# Patient Record
Sex: Female | Born: 1975 | Race: White | Hispanic: No | Marital: Single | Smoking: Current every day smoker
Health system: Southern US, Community
[De-identification: ages and names within clinical notes are randomized; demographics above are authoritative.]

## PROBLEM LIST (undated history)

## (undated) DIAGNOSIS — M5412 Radiculopathy, cervical region: Secondary | ICD-10-CM

## (undated) DIAGNOSIS — J45909 Unspecified asthma, uncomplicated: Secondary | ICD-10-CM

## (undated) DIAGNOSIS — G894 Chronic pain syndrome: Secondary | ICD-10-CM

## (undated) DIAGNOSIS — E7800 Pure hypercholesterolemia, unspecified: Secondary | ICD-10-CM

## (undated) HISTORY — PX: OTHER SURGICAL HISTORY: SHX169

## (undated) HISTORY — PX: TONSILLECTOMY AND ADENOIDECTOMY: SUR1326

## (undated) HISTORY — DX: Pure hypercholesterolemia, unspecified: E78.00

---

## 2014-05-20 ENCOUNTER — Encounter (HOSPITAL_COMMUNITY): Payer: Self-pay | Admitting: Emergency Medicine

## 2014-05-20 ENCOUNTER — Emergency Department (HOSPITAL_COMMUNITY)
Admission: EM | Admit: 2014-05-20 | Discharge: 2014-05-20 | Disposition: A | Discharge status: Home / Self Care | Payer: Medicaid Other | Attending: Emergency Medicine | Admitting: Emergency Medicine

## 2014-05-20 DIAGNOSIS — Z720 Tobacco use: Secondary | ICD-10-CM | POA: Insufficient documentation

## 2014-05-20 DIAGNOSIS — S5011XA Contusion of right forearm, initial encounter: Secondary | ICD-10-CM | POA: Insufficient documentation

## 2014-05-20 DIAGNOSIS — S5012XA Contusion of left forearm, initial encounter: Secondary | ICD-10-CM | POA: Insufficient documentation

## 2014-05-20 DIAGNOSIS — R4182 Altered mental status, unspecified: Secondary | ICD-10-CM

## 2014-05-20 DIAGNOSIS — Y9389 Activity, other specified: Secondary | ICD-10-CM | POA: Diagnosis not present

## 2014-05-20 DIAGNOSIS — S60222A Contusion of left hand, initial encounter: Secondary | ICD-10-CM | POA: Diagnosis not present

## 2014-05-20 DIAGNOSIS — Y92009 Unspecified place in unspecified non-institutional (private) residence as the place of occurrence of the external cause: Secondary | ICD-10-CM | POA: Insufficient documentation

## 2014-05-20 DIAGNOSIS — Y998 Other external cause status: Secondary | ICD-10-CM | POA: Diagnosis not present

## 2014-05-20 DIAGNOSIS — F10129 Alcohol abuse with intoxication, unspecified: Secondary | ICD-10-CM | POA: Diagnosis not present

## 2014-05-20 DIAGNOSIS — W2209XA Striking against other stationary object, initial encounter: Secondary | ICD-10-CM | POA: Insufficient documentation

## 2014-05-20 DIAGNOSIS — Z3202 Encounter for pregnancy test, result negative: Secondary | ICD-10-CM | POA: Insufficient documentation

## 2014-05-20 DIAGNOSIS — F1010 Alcohol abuse, uncomplicated: Secondary | ICD-10-CM | POA: Diagnosis present

## 2014-05-20 DIAGNOSIS — F10929 Alcohol use, unspecified with intoxication, unspecified: Secondary | ICD-10-CM

## 2014-05-20 LAB — URINE MICROSCOPIC-ADD ON

## 2014-05-20 LAB — COMPREHENSIVE METABOLIC PANEL
ALBUMIN: 3.6 g/dL (ref 3.5–5.2)
ALT: 10 U/L (ref 0–35)
ANION GAP: 9 (ref 5–15)
AST: 20 U/L (ref 0–37)
Alkaline Phosphatase: 39 U/L (ref 39–117)
BILIRUBIN TOTAL: 0.6 mg/dL (ref 0.3–1.2)
BUN: 12 mg/dL (ref 6–23)
CO2: 19 mmol/L (ref 19–32)
Calcium: 8.5 mg/dL (ref 8.4–10.5)
Chloride: 109 mmol/L (ref 96–112)
Creatinine, Ser: 0.72 mg/dL (ref 0.50–1.10)
GFR calc Af Amer: >90 mL/min (ref >90)
GFR calc non Af Amer: >90 mL/min (ref >90)
Glucose, Bld: 89 mg/dL (ref 70–99)
Potassium: 3.8 mmol/L (ref 3.5–5.1)
Sodium: 137 mmol/L (ref 135–145)
Total Protein: 5.8 g/dL — ABNORMAL LOW (ref 6.0–8.3)

## 2014-05-20 LAB — CBC WITH DIFFERENTIAL/PLATELET
BASOS ABS: 0 10*3/uL (ref 0.0–0.1)
Basophils Relative: 0 % (ref 0–1)
EOS ABS: 0 10*3/uL (ref 0.0–0.7)
Eosinophils Relative: 0 % (ref 0–5)
HEMATOCRIT: 34.1 % — AB (ref 36.0–46.0)
Hemoglobin: 11.5 g/dL — ABNORMAL LOW (ref 12.0–15.0)
LYMPHS ABS: 1.1 10*3/uL (ref 0.7–4.0)
Lymphocytes Relative: 7 % — ABNORMAL LOW (ref 12–46)
MCH: 30.2 pg (ref 26.0–34.0)
MCHC: 33.7 g/dL (ref 30.0–36.0)
MCV: 89.5 fL (ref 78.0–100.0)
MONO ABS: 1 10*3/uL (ref 0.1–1.0)
Monocytes Relative: 6 % (ref 3–12)
NEUTROS PCT: 87 % — AB (ref 43–77)
Neutro Abs: 13.5 10*3/uL — ABNORMAL HIGH (ref 1.7–7.7)
PLATELETS: 245 10*3/uL (ref 150–400)
RBC: 3.81 MIL/uL — ABNORMAL LOW (ref 3.87–5.11)
RDW: 13 % (ref 11.5–15.5)
WBC: 15.5 10*3/uL — AB (ref 4.0–10.5)

## 2014-05-20 LAB — URINALYSIS, ROUTINE W REFLEX MICROSCOPIC
BILIRUBIN URINE: NEGATIVE
GLUCOSE, UA: NEGATIVE mg/dL
HGB URINE DIPSTICK: NEGATIVE
KETONES UR: NEGATIVE mg/dL
Leukocytes, UA: NEGATIVE
Nitrite: POSITIVE — AB
PROTEIN: NEGATIVE mg/dL
Specific Gravity, Urine: 1.008 (ref 1.005–1.030)
Urobilinogen, UA: 0.2 mg/dL (ref 0.0–1.0)
pH: 5.5 (ref 5.0–8.0)

## 2014-05-20 LAB — RAPID URINE DRUG SCREEN, HOSP PERFORMED
Amphetamines: NOT DETECTED
BARBITURATES: NOT DETECTED
Benzodiazepines: NOT DETECTED
COCAINE: NOT DETECTED
Opiates: NOT DETECTED
TETRAHYDROCANNABINOL: NOT DETECTED

## 2014-05-20 LAB — PREGNANCY, URINE: PREG TEST UR: NEGATIVE

## 2014-05-20 LAB — ACETAMINOPHEN LEVEL

## 2014-05-20 LAB — ETHANOL: ALCOHOL ETHYL (B): 140 mg/dL — AB (ref 0–9)

## 2014-05-20 MED ORDER — SODIUM CHLORIDE 0.9 % IV SOLN: 1000.0000 mL | INTRAVENOUS | Status: DC

## 2014-05-20 MED ORDER — SODIUM CHLORIDE 0.9 % IV SOLN
1000.0000 mL | Freq: Once | INTRAVENOUS | Status: AC
  Administered 2014-05-20: 1000 mL via INTRAVENOUS

## 2014-05-20 MED ORDER — ONDANSETRON 8 MG PO TBDP: 8.0000 mg | ORAL_TABLET | Freq: Three times a day (TID) | ORAL | Status: DC | PRN

## 2014-05-20 NOTE — Discharge Instructions (Signed)
Alcohol Intoxication  Alcohol intoxication occurs when the amount of alcohol that a person has consumed impairs his or her ability to mentally and physically function. Alcohol directly impairs the normal chemical activity of the brain. Drinking large amounts of alcohol can lead to changes in mental function and behavior, and it can cause many physical effects that can be harmful.   Alcohol intoxication can range in severity from mild to very severe. Various factors can affect the level of intoxication that occurs, such as the person's age, gender, weight, frequency of alcohol consumption, and the presence of other medical conditions (such as diabetes, seizures, or heart conditions). Dangerous levels of alcohol intoxication may occur when people drink large amounts of alcohol in a short period (binge drinking). Alcohol can also be especially dangerous when combined with certain prescription medicines or "recreational" drugs.  SIGNS AND SYMPTOMS  Some common signs and symptoms of mild alcohol intoxication include:  · Loss of coordination.  · Changes in mood and behavior.  · Impaired judgment.  · Slurred speech.  As alcohol intoxication progresses to more severe levels, other signs and symptoms will appear. These may include:  · Vomiting.  · Confusion and impaired memory.  · Slowed breathing.  · Seizures.  · Loss of consciousness.  DIAGNOSIS   Your health care provider will take a medical history and perform a physical exam. You will be asked about the amount and type of alcohol you have consumed. Blood tests will be done to measure the concentration of alcohol in your blood. In many places, your blood alcohol level must be lower than 80 mg/dL (0.08%) to legally drive. However, many dangerous effects of alcohol can occur at much lower levels.   TREATMENT   People with alcohol intoxication often do not require treatment. Most of the effects of alcohol intoxication are temporary, and they go away as the alcohol naturally  leaves the body. Your health care provider will monitor your condition until you are stable enough to go home. Fluids are sometimes given through an IV access tube to help prevent dehydration.   HOME CARE INSTRUCTIONS  · Do not drive after drinking alcohol.  · Stay hydrated. Drink enough water and fluids to keep your urine clear or pale yellow. Avoid caffeine.    · Only take over-the-counter or prescription medicines as directed by your health care provider.    SEEK MEDICAL CARE IF:   · You have persistent vomiting.    · You do not feel better after a few days.  · You have frequent alcohol intoxication. Your health care provider can help determine if you should see a substance use treatment counselor.  SEEK IMMEDIATE MEDICAL CARE IF:   · You become shaky or tremble when you try to stop drinking.    · You shake uncontrollably (seizure).    · You throw up (vomit) blood. This may be bright red or may look like black coffee grounds.    · You have blood in your stool. This may be bright red or may appear as a black, tarry, bad smelling stool.    · You become lightheaded or faint.    MAKE SURE YOU:   · Understand these instructions.  · Will watch your condition.  · Will get help right away if you are not doing well or get worse.  Document Released: 12/31/2004 Document Revised: 11/23/2012 Document Reviewed: 08/26/2012  ExitCare® Patient Information ©2015 [employer]. This information is not intended to replace advice given to you by your health care provider. Make sure   you discuss any questions you have with your health care provider.

## 2014-05-20 NOTE — ED Provider Notes (Signed)
  Physical Exam  BP 100/69 mmHg  Pulse 84  Temp(Src) 97.6 F (36.4 C) (Oral)  Resp 16  SpO2 100%  Physical Exam  ED Course  Procedures  MDM Patient with alcohol intoxication and altered mental status. Improved now blood pressures improved. Has ambulated and will be discharged home.      Juliet RudeNathan R. Rubin PayorPickering, MD 05/20/14 [id number]

## 2014-05-20 NOTE — ED Notes (Signed)
Bed: [id number] Expected date: 05/20/14 Expected time: 3:52 AM Means of arrival: Ambulance Comments: 39 yo F  ETOH

## 2014-05-20 NOTE — ED Notes (Signed)
Pt ambulatory to restroom with standby assist. This is baseline per pt.
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
## 2015-05-10 ENCOUNTER — Emergency Department (HOSPITAL_COMMUNITY)
Admission: EM | Admit: 2015-05-10 | Discharge: 2015-05-11 | Disposition: A | Discharge status: Home / Self Care | Payer: Self-pay | Attending: Emergency Medicine | Admitting: Emergency Medicine

## 2015-05-10 ENCOUNTER — Emergency Department (HOSPITAL_COMMUNITY): Payer: Self-pay

## 2015-05-10 ENCOUNTER — Encounter (HOSPITAL_COMMUNITY): Payer: Self-pay | Admitting: *Deleted

## 2015-05-10 DIAGNOSIS — R000 Tachycardia, unspecified: Secondary | ICD-10-CM | POA: Insufficient documentation

## 2015-05-10 DIAGNOSIS — R0789 Other chest pain: Secondary | ICD-10-CM | POA: Insufficient documentation

## 2015-05-10 DIAGNOSIS — F17200 Nicotine dependence, unspecified, uncomplicated: Secondary | ICD-10-CM | POA: Insufficient documentation

## 2015-05-10 DIAGNOSIS — R200 Anesthesia of skin: Secondary | ICD-10-CM | POA: Insufficient documentation

## 2015-05-10 DIAGNOSIS — R079 Chest pain, unspecified: Secondary | ICD-10-CM

## 2015-05-10 DIAGNOSIS — R112 Nausea with vomiting, unspecified: Secondary | ICD-10-CM | POA: Insufficient documentation

## 2015-05-10 DIAGNOSIS — J45901 Unspecified asthma with (acute) exacerbation: Secondary | ICD-10-CM | POA: Insufficient documentation

## 2015-05-10 HISTORY — DX: Unspecified asthma, uncomplicated: J45.909

## 2015-05-10 LAB — BASIC METABOLIC PANEL
ANION GAP: 13 (ref 5–15)
BUN: 13 mg/dL (ref 6–20)
CHLORIDE: 105 mmol/L (ref 101–111)
CO2: 21 mmol/L — ABNORMAL LOW (ref 22–32)
Calcium: 10.7 mg/dL — ABNORMAL HIGH (ref 8.9–10.3)
Creatinine, Ser: 0.82 mg/dL (ref 0.44–1.00)
Glucose, Bld: 124 mg/dL — ABNORMAL HIGH (ref 65–99)
POTASSIUM: 3.8 mmol/L (ref 3.5–5.1)
SODIUM: 139 mmol/L (ref 135–145)

## 2015-05-10 LAB — CBC
HEMATOCRIT: 39.1 % (ref 36.0–46.0)
Hemoglobin: 13.7 g/dL (ref 12.0–15.0)
MCH: 30.9 pg (ref 26.0–34.0)
MCHC: 35 g/dL (ref 30.0–36.0)
MCV: 88.3 fL (ref 78.0–100.0)
Platelets: 301 10*3/uL (ref 150–400)
RBC: 4.43 MIL/uL (ref 3.87–5.11)
RDW: 13.5 % (ref 11.5–15.5)
WBC: 15.4 10*3/uL — AB (ref 4.0–10.5)

## 2015-05-10 LAB — TROPONIN I: Troponin I: <0.03 ng/mL (ref <0.031)

## 2015-05-10 MED ORDER — FUROSEMIDE 10 MG/ML IJ SOLN
80.0000 mg | Freq: Once | INTRAMUSCULAR | Status: DC
  Filled 2015-05-10: qty 8
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
## 2015-05-10 NOTE — ED Notes (Signed)
The pt  Has had lt sided chest pain since 1400 with dizziness sob.  She is currently hyperventilating.  lmp a few days ago

## 2015-05-11 ENCOUNTER — Encounter (HOSPITAL_COMMUNITY): Payer: Self-pay | Admitting: Radiology

## 2015-05-11 ENCOUNTER — Emergency Department (HOSPITAL_COMMUNITY): Payer: Medicaid Other

## 2015-05-11 MED ORDER — IOHEXOL 350 MG/ML SOLN
80.0000 mL | Freq: Once | INTRAVENOUS | Status: AC | PRN
  Administered 2015-05-11: 80 mL via INTRAVENOUS

## 2015-05-11 MED ORDER — SODIUM CHLORIDE 0.9 % IV BOLUS (SEPSIS)
1000.0000 mL | Freq: Once | INTRAVENOUS | Status: AC
  Administered 2015-05-11: 1000 mL via INTRAVENOUS

## 2015-05-11 NOTE — ED Notes (Signed)
Dr. Oni at bedside. 

## 2015-05-11 NOTE — Discharge Instructions (Signed)
Nonspecific Chest Pain Shannon Morris, your CT scan results are below.  See a primary care doctor within 3 days for close follow up.  If symptoms worsen, come back to the ED immediately.  Thank you.   IMPRESSION: 1. No evidence of pulmonary embolism or other acute finding. 2. Centrilobular emphysema. 3. 4 mm left lower lobe pulmonary nodule is likely benign but given #2, follow-up chest CT at 1 year is recommended. This recommendation follows the consensus statement: Guidelines for Management of Small Pulmonary Nodules Detected on CT Scans  It is often hard to find the cause of chest pain. There is always a chance that your pain could be related to something serious, such as a heart attack or a blood clot in your lungs. Chest pain can also be caused by conditions that are not life-threatening. If you have chest pain, it is very important to follow up with your doctor.  HOME CARE  If you were prescribed an antibiotic medicine, finish it all even if you start to feel better.  Avoid any activities that cause chest pain.  Do not use any tobacco products, including cigarettes, chewing tobacco, or electronic cigarettes. If you need help quitting, ask your doctor.  Do not drink alcohol.  Take medicines only as told by your doctor.  Keep all follow-up visits as told by your doctor. This is important. This includes any further testing if your chest pain does not go away.  Your doctor may tell you to keep your head raised (elevated) while you sleep.  Make lifestyle changes as told by your doctor. These may include:  Getting regular exercise. Ask your doctor to suggest some activities that are safe for you.  Eating a heart-healthy diet. Your doctor or a diet specialist (dietitian) can help you to learn healthy eating options.  Maintaining a healthy weight.  Managing diabetes, if necessary.  Reducing stress. GET HELP IF:  Your chest pain does not go away, even after treatment.  You have  a rash with blisters on your chest.  You have a fever. GET HELP RIGHT AWAY IF:  Your chest pain is worse.  You have an increasing cough, or you cough up blood.  You have severe belly (abdominal) pain.  You feel extremely weak.  You pass out (faint).  You have chills.  You have sudden, unexplained chest discomfort.  You have sudden, unexplained discomfort in your arms, back, neck, or jaw.  You have shortness of breath at any time.  You suddenly start to sweat, or your skin gets clammy.  You feel nauseous.  You vomit.  You suddenly feel light-headed or dizzy.  Your heart begins to beat quickly, or it feels like it is skipping beats. These symptoms may be an emergency. Do not wait to see if the symptoms will go away. Get medical help right away. Call your local emergency services (911 in the [place] Do not drive yourself to the hospital.   This information is not intended to replace advice given to you by your health care provider. Make sure you discuss any questions you have with your health care provider.   Document Released: 09/09/2007 Document Revised: 04/13/2014 Document Reviewed: 10/27/2013 Elsevier Interactive Patient Education [employer]

## 2017-05-25 IMAGING — DX DG CHEST 2V
2 series · 2 of 2 positions shown · non-contrast
Comparison: None.

CLINICAL DATA: Chest pain today.  Shortness of breath and emesis.

EXAM:
CHEST  2 VIEW

[chest pa]
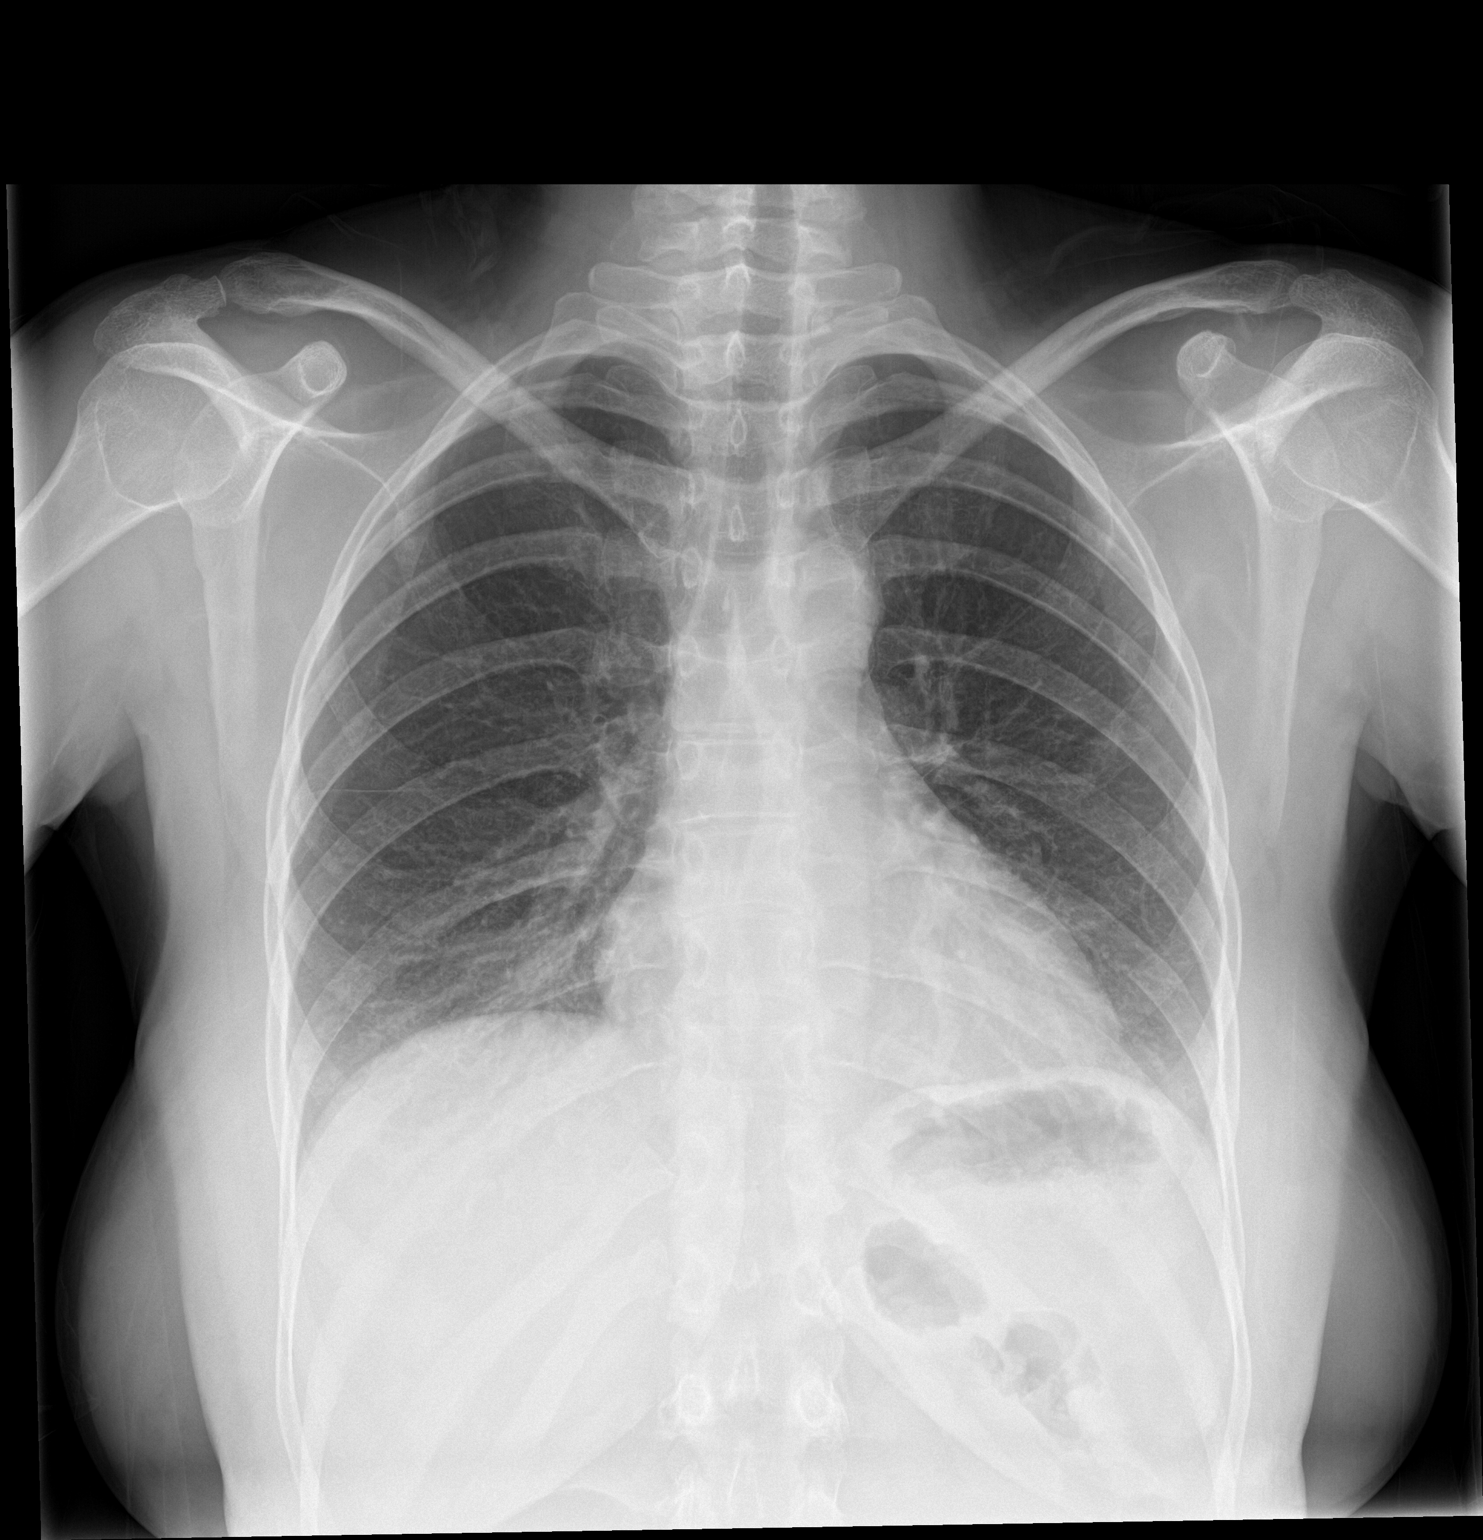

[chest lat]
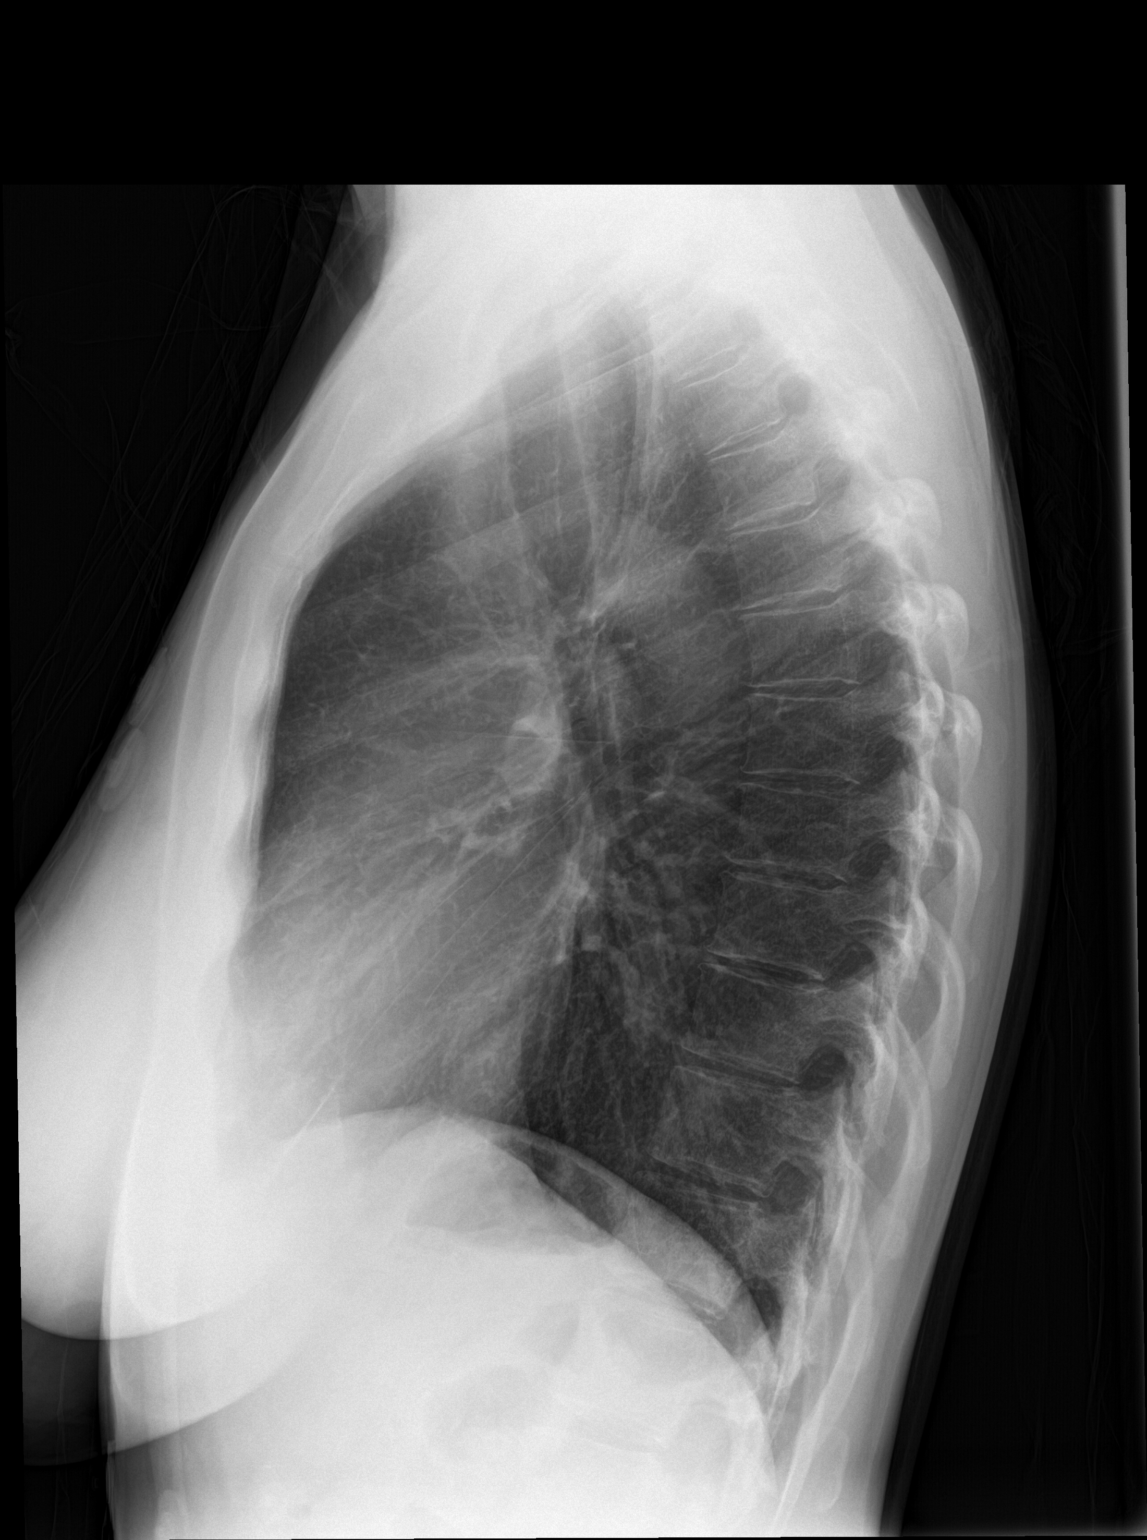

[2 of 2 positions shown; findings below may reference images not displayed]

FINDINGS: The cardiomediastinal contours are normal. The lungs are clear.
Pulmonary vasculature is normal. No consolidation, pleural effusion,
or pneumothorax. No acute osseous abnormalities are seen.
IMPRESSION: No acute pulmonary process.

## 2017-12-08 ENCOUNTER — Emergency Department (HOSPITAL_BASED_OUTPATIENT_CLINIC_OR_DEPARTMENT_OTHER)
Admission: EM | Admit: 2017-12-08 | Discharge: 2017-12-08 | Disposition: A | Discharge status: Home / Self Care | Payer: No Typology Code available for payment source | Attending: Emergency Medicine | Admitting: Emergency Medicine

## 2017-12-08 ENCOUNTER — Other Ambulatory Visit: Payer: Self-pay

## 2017-12-08 ENCOUNTER — Encounter (HOSPITAL_BASED_OUTPATIENT_CLINIC_OR_DEPARTMENT_OTHER): Payer: Self-pay

## 2017-12-08 DIAGNOSIS — Y998 Other external cause status: Secondary | ICD-10-CM | POA: Diagnosis not present

## 2017-12-08 DIAGNOSIS — Y9389 Activity, other specified: Secondary | ICD-10-CM | POA: Diagnosis not present

## 2017-12-08 DIAGNOSIS — J45909 Unspecified asthma, uncomplicated: Secondary | ICD-10-CM | POA: Insufficient documentation

## 2017-12-08 DIAGNOSIS — R51 Headache: Secondary | ICD-10-CM | POA: Insufficient documentation

## 2017-12-08 DIAGNOSIS — S161XXA Strain of muscle, fascia and tendon at neck level, initial encounter: Secondary | ICD-10-CM | POA: Diagnosis not present

## 2017-12-08 DIAGNOSIS — X503XXA Overexertion from repetitive movements, initial encounter: Secondary | ICD-10-CM

## 2017-12-08 DIAGNOSIS — R519 Headache, unspecified: Secondary | ICD-10-CM

## 2017-12-08 DIAGNOSIS — F17210 Nicotine dependence, cigarettes, uncomplicated: Secondary | ICD-10-CM | POA: Diagnosis not present

## 2017-12-08 DIAGNOSIS — S199XXA Unspecified injury of neck, initial encounter: Secondary | ICD-10-CM | POA: Diagnosis present

## 2017-12-08 DIAGNOSIS — Y92410 Unspecified street and highway as the place of occurrence of the external cause: Secondary | ICD-10-CM | POA: Diagnosis not present

## 2017-12-08 HISTORY — DX: Radiculopathy, cervical region: M54.12

## 2017-12-08 HISTORY — DX: Chronic pain syndrome: G89.4

## 2017-12-08 MED ORDER — DEXAMETHASONE SODIUM PHOSPHATE 10 MG/ML IJ SOLN
10.0000 mg | Freq: Once | INTRAMUSCULAR | Status: AC
  Administered 2017-12-08: 10 mg via INTRAVENOUS
  Filled 2017-12-08: qty 1

## 2017-12-08 MED ORDER — METOCLOPRAMIDE HCL 5 MG/ML IJ SOLN
10.0000 mg | Freq: Once | INTRAMUSCULAR | Status: AC
Start: 2017-12-08 — End: 2017-12-08
  Administered 2017-12-08: 10 mg via INTRAMUSCULAR
  Filled 2017-12-08: qty 2

## 2017-12-08 MED ORDER — LORAZEPAM 2 MG/ML IJ SOLN
0.5000 mg | Freq: Once | INTRAMUSCULAR | Status: AC
  Administered 2017-12-08: 0.5 mg via INTRAVENOUS
  Filled 2017-12-08: qty 1

## 2017-12-08 MED ORDER — MELOXICAM 15 MG PO TABS: 15.0000 mg | ORAL_TABLET | Freq: Every day | ORAL | 0 refills | Status: DC

## 2017-12-08 MED ORDER — CYCLOBENZAPRINE HCL 10 MG PO TABS: 5.0000 mg | ORAL_TABLET | Freq: Two times a day (BID) | ORAL | 0 refills | Status: DC | PRN

## 2017-12-08 MED ORDER — KETOROLAC TROMETHAMINE 30 MG/ML IJ SOLN
15.0000 mg | Freq: Once | INTRAMUSCULAR | Status: AC
  Administered 2017-12-08: 15 mg via INTRAVENOUS
  Filled 2017-12-08: qty 1
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
## 2017-12-08 NOTE — ED Notes (Signed)
Pt resting with eyes closed, states headache is better, drinking water

## 2017-12-08 NOTE — Discharge Instructions (Addendum)
Return to the emergency department immediately if you develop any of the following symptoms: You have numbness, tingling, or weakness in the arms or legs. You develop severe headaches not relieved with medicine. You have severe neck pain, especially tenderness in the middle of the back of your neck. You have changes in bowel or bladder control. There is increasing pain in any area of the body. You have shortness of breath, light-headedness, dizziness, or fainting. You have chest pain. You feel sick to your stomach (nauseous), throw up (vomit), or sweat. You have increasing abdominal discomfort. There is blood in your urine, stool, or vomit. You have pain in your shoulder (shoulder strap areas). You feel your symptoms are getting worse. You are having a headache. No specific cause was found today for your headache. It may have been a migraine or other cause of headache. Stress, anxiety, fatigue, and depression are common triggers for headaches. Your headache today does not appear to be life-threatening or require hospitalization, but often the exact cause of headaches is not determined in the emergency department. Therefore, follow-up with your doctor is very important to find out what may have caused your headache, and whether or not you need any further diagnostic testing or treatment. Sometimes headaches can appear benign (not harmful), but then more serious symptoms can develop which should prompt an immediate re-evaluation by your doctor or the emergency department. SEEK MEDICAL ATTENTION IF: You develop possible problems with medications prescribed.  The medications don't resolve your headache, if it recurs , or if you have multiple episodes of vomiting or can't take fluids. You have a change from the usual headache. RETURN IMMEDIATELY IF you develop a sudden, severe headache or confusion, become poorly responsive or faint, develop a fever above 100.25F or problem breathing, have a change in  speech, vision, swallowing, or understanding, or develop new weakness, numbness, tingling, incoordination, or have a seizure.

## 2017-12-08 NOTE — ED Notes (Signed)
Updated to wait time 
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
## 2017-12-08 NOTE — ED Notes (Signed)
ED Provider at bedside. 

## 2017-12-08 NOTE — ED Notes (Addendum)
Pt crying and uncooperative during iv insertion, pt is cursing at staff

## 2020-10-13 ENCOUNTER — Encounter (HOSPITAL_COMMUNITY): Payer: Self-pay | Admitting: *Deleted

## 2020-10-13 ENCOUNTER — Other Ambulatory Visit: Payer: Self-pay

## 2020-10-13 ENCOUNTER — Ambulatory Visit (HOSPITAL_COMMUNITY)
Admission: EM | Admit: 2020-10-13 | Discharge: 2020-10-13 | Disposition: A | Discharge status: Home / Self Care | Payer: 59 | Attending: Medical Oncology | Admitting: Medical Oncology

## 2020-10-13 DIAGNOSIS — M5441 Lumbago with sciatica, right side: Secondary | ICD-10-CM

## 2020-10-13 MED ORDER — PREDNISONE 10 MG PO TABS: ORAL_TABLET | ORAL | 0 refills | Status: AC

## 2020-10-13 MED ORDER — CYCLOBENZAPRINE HCL 5 MG PO TABS: 5.0000 mg | ORAL_TABLET | Freq: Three times a day (TID) | ORAL | 0 refills | Status: DC | PRN

## 2020-10-13 NOTE — ED Triage Notes (Signed)
Pt reports back pain that radiates down RT Leg.
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
## 2021-10-20 ENCOUNTER — Other Ambulatory Visit: Payer: Self-pay | Admitting: Internal Medicine

## 2021-10-20 DIAGNOSIS — R911 Solitary pulmonary nodule: Secondary | ICD-10-CM

## 2021-10-20 DIAGNOSIS — Z1231 Encounter for screening mammogram for malignant neoplasm of breast: Secondary | ICD-10-CM

## 2021-10-28 ENCOUNTER — Ambulatory Visit
Admission: RE | Admit: 2021-10-28 | Discharge: 2021-10-28 | Disposition: A | Discharge status: Home / Self Care | Payer: 59 | Source: Ambulatory Visit | Attending: Internal Medicine | Admitting: Internal Medicine

## 2021-10-28 DIAGNOSIS — R911 Solitary pulmonary nodule: Secondary | ICD-10-CM

## 2021-10-31 ENCOUNTER — Other Ambulatory Visit: Payer: Self-pay | Admitting: Internal Medicine

## 2021-10-31 DIAGNOSIS — S2000XA Contusion of breast, unspecified breast, initial encounter: Secondary | ICD-10-CM

## 2021-11-18 ENCOUNTER — Other Ambulatory Visit: Payer: Self-pay

## 2021-11-18 DIAGNOSIS — R0602 Shortness of breath: Secondary | ICD-10-CM

## 2021-11-19 ENCOUNTER — Ambulatory Visit (INDEPENDENT_AMBULATORY_CARE_PROVIDER_SITE_OTHER): Payer: 59 | Admitting: Internal Medicine

## 2021-11-19 DIAGNOSIS — R0602 Shortness of breath: Secondary | ICD-10-CM

## 2021-11-19 LAB — PULMONARY FUNCTION TEST
DL/VA % pred: 61 %
DL/VA: 2.66 ml/min/mmHg/L
DLCO cor % pred: 66 %
DLCO cor: 14.54 ml/min/mmHg
DLCO unc % pred: 66 %
DLCO unc: 14.54 ml/min/mmHg
FEF 25-75 Post: 1.87 L/sec
FEF 25-75 Pre: 1.61 L/sec
FEF2575-%Change-Post: 15 %
FEF2575-%Pred-Post: 64 %
FEF2575-%Pred-Pre: 55 %
FEV1-%Change-Post: 2 %
FEV1-%Pred-Post: 93 %
FEV1-%Pred-Pre: 90 %
FEV1-Post: 2.74 L
FEV1-Pre: 2.67 L
FEV1FVC-%Change-Post: 0 %
FEV1FVC-%Pred-Pre: 87 %
FEV6-%Change-Post: 1 %
FEV6-%Pred-Post: 105 %
FEV6-%Pred-Pre: 103 %
FEV6-Post: 3.81 L
FEV6-Pre: 3.73 L
FEV6FVC-%Change-Post: -1 %
FEV6FVC-%Pred-Post: 99 %
FEV6FVC-%Pred-Pre: 101 %
FVC-%Change-Post: 3 %
FVC-%Pred-Post: 105 %
FVC-%Pred-Pre: 102 %
FVC-Post: 3.92 L
FVC-Pre: 3.78 L
Post FEV1/FVC ratio: 70 %
Post FEV6/FVC ratio: 97 %
Pre FEV1/FVC ratio: 71 %
Pre FEV6/FVC Ratio: 99 %
RV % pred: 141 %
RV: 2.55 L
TLC % pred: 124 %
TLC: 6.45 L

## 2021-11-19 NOTE — Progress Notes (Signed)
Full PFT Completed Today 

## 2021-11-19 NOTE — Patient Instructions (Signed)
Full PFT Completed Today 

## 2021-12-04 ENCOUNTER — Other Ambulatory Visit: Payer: Self-pay | Admitting: Internal Medicine

## 2021-12-04 ENCOUNTER — Ambulatory Visit
Admission: RE | Admit: 2021-12-04 | Discharge: 2021-12-04 | Disposition: A | Discharge status: Home / Self Care | Payer: 59 | Source: Ambulatory Visit | Attending: Internal Medicine | Admitting: Internal Medicine

## 2021-12-04 ENCOUNTER — Ambulatory Visit: Payer: 59

## 2021-12-04 DIAGNOSIS — N644 Mastodynia: Secondary | ICD-10-CM

## 2021-12-04 DIAGNOSIS — S2000XA Contusion of breast, unspecified breast, initial encounter: Secondary | ICD-10-CM

## 2022-04-29 DIAGNOSIS — M059 Rheumatoid arthritis with rheumatoid factor, unspecified: Secondary | ICD-10-CM | POA: Diagnosis not present

## 2022-04-29 DIAGNOSIS — M542 Cervicalgia: Secondary | ICD-10-CM | POA: Diagnosis not present

## 2022-04-29 DIAGNOSIS — R768 Other specified abnormal immunological findings in serum: Secondary | ICD-10-CM | POA: Diagnosis not present

## 2022-04-29 DIAGNOSIS — M549 Dorsalgia, unspecified: Secondary | ICD-10-CM | POA: Diagnosis not present

## 2022-04-29 DIAGNOSIS — M1990 Unspecified osteoarthritis, unspecified site: Secondary | ICD-10-CM | POA: Diagnosis not present

## 2022-04-29 DIAGNOSIS — M5137 Other intervertebral disc degeneration, lumbosacral region: Secondary | ICD-10-CM | POA: Diagnosis not present

## 2022-04-29 DIAGNOSIS — M797 Fibromyalgia: Secondary | ICD-10-CM | POA: Diagnosis not present

## 2022-04-29 DIAGNOSIS — M791 Myalgia, unspecified site: Secondary | ICD-10-CM | POA: Diagnosis not present

## 2022-04-30 DIAGNOSIS — G8929 Other chronic pain: Secondary | ICD-10-CM | POA: Diagnosis not present

## 2022-04-30 DIAGNOSIS — J449 Chronic obstructive pulmonary disease, unspecified: Secondary | ICD-10-CM | POA: Diagnosis not present

## 2022-04-30 DIAGNOSIS — Z716 Tobacco abuse counseling: Secondary | ICD-10-CM | POA: Diagnosis not present

## 2022-04-30 DIAGNOSIS — F419 Anxiety disorder, unspecified: Secondary | ICD-10-CM | POA: Diagnosis not present

## 2022-04-30 DIAGNOSIS — K529 Noninfective gastroenteritis and colitis, unspecified: Secondary | ICD-10-CM | POA: Diagnosis not present

## 2022-05-21 DIAGNOSIS — M129 Arthropathy, unspecified: Secondary | ICD-10-CM | POA: Diagnosis not present

## 2022-05-21 DIAGNOSIS — Z79899 Other long term (current) drug therapy: Secondary | ICD-10-CM | POA: Diagnosis not present

## 2022-05-22 DIAGNOSIS — M25551 Pain in right hip: Secondary | ICD-10-CM | POA: Diagnosis not present

## 2022-05-22 DIAGNOSIS — M542 Cervicalgia: Secondary | ICD-10-CM | POA: Diagnosis not present

## 2022-06-05 DIAGNOSIS — R1310 Dysphagia, unspecified: Secondary | ICD-10-CM | POA: Diagnosis not present

## 2022-06-05 DIAGNOSIS — Z1211 Encounter for screening for malignant neoplasm of colon: Secondary | ICD-10-CM | POA: Diagnosis not present

## 2022-06-09 DIAGNOSIS — K648 Other hemorrhoids: Secondary | ICD-10-CM | POA: Diagnosis not present

## 2022-06-09 DIAGNOSIS — K2930 Chronic superficial gastritis without bleeding: Secondary | ICD-10-CM | POA: Diagnosis not present

## 2022-06-09 DIAGNOSIS — K2289 Other specified disease of esophagus: Secondary | ICD-10-CM | POA: Diagnosis not present

## 2022-06-09 DIAGNOSIS — B9681 Helicobacter pylori [H. pylori] as the cause of diseases classified elsewhere: Secondary | ICD-10-CM | POA: Diagnosis not present

## 2022-06-09 DIAGNOSIS — R1310 Dysphagia, unspecified: Secondary | ICD-10-CM | POA: Diagnosis not present

## 2022-06-09 DIAGNOSIS — K2281 Esophageal polyp: Secondary | ICD-10-CM | POA: Diagnosis not present

## 2022-06-09 DIAGNOSIS — R140 Abdominal distension (gaseous): Secondary | ICD-10-CM | POA: Diagnosis not present

## 2022-06-09 DIAGNOSIS — K3189 Other diseases of stomach and duodenum: Secondary | ICD-10-CM | POA: Diagnosis not present

## 2022-06-09 DIAGNOSIS — K5730 Diverticulosis of large intestine without perforation or abscess without bleeding: Secondary | ICD-10-CM | POA: Diagnosis not present

## 2022-06-09 DIAGNOSIS — Z1211 Encounter for screening for malignant neoplasm of colon: Secondary | ICD-10-CM | POA: Diagnosis not present

## 2022-06-15 DIAGNOSIS — M797 Fibromyalgia: Secondary | ICD-10-CM | POA: Diagnosis not present

## 2022-06-15 DIAGNOSIS — M7918 Myalgia, other site: Secondary | ICD-10-CM | POA: Diagnosis not present

## 2022-06-15 DIAGNOSIS — Z79899 Other long term (current) drug therapy: Secondary | ICD-10-CM | POA: Diagnosis not present

## 2022-06-15 DIAGNOSIS — N1831 Chronic kidney disease, stage 3a: Secondary | ICD-10-CM | POA: Diagnosis not present

## 2022-06-15 DIAGNOSIS — M5136 Other intervertebral disc degeneration, lumbar region: Secondary | ICD-10-CM | POA: Diagnosis not present

## 2022-06-15 DIAGNOSIS — M47812 Spondylosis without myelopathy or radiculopathy, cervical region: Secondary | ICD-10-CM | POA: Diagnosis not present

## 2022-06-15 DIAGNOSIS — Z6820 Body mass index (BMI) 20.0-20.9, adult: Secondary | ICD-10-CM | POA: Diagnosis not present

## 2022-06-15 DIAGNOSIS — M47816 Spondylosis without myelopathy or radiculopathy, lumbar region: Secondary | ICD-10-CM | POA: Diagnosis not present

## 2022-06-15 DIAGNOSIS — M48061 Spinal stenosis, lumbar region without neurogenic claudication: Secondary | ICD-10-CM | POA: Diagnosis not present

## 2022-06-25 ENCOUNTER — Other Ambulatory Visit: Payer: Self-pay

## 2022-06-25 ENCOUNTER — Emergency Department (HOSPITAL_COMMUNITY)
Admission: EM | Admit: 2022-06-25 | Discharge: 2022-06-26 | Disposition: A | Discharge status: Home / Self Care | Payer: 59 | Attending: Emergency Medicine | Admitting: Emergency Medicine

## 2022-06-25 ENCOUNTER — Emergency Department (HOSPITAL_COMMUNITY): Payer: 59

## 2022-06-25 ENCOUNTER — Encounter (HOSPITAL_COMMUNITY): Payer: Self-pay

## 2022-06-25 DIAGNOSIS — N1831 Chronic kidney disease, stage 3a: Secondary | ICD-10-CM | POA: Diagnosis not present

## 2022-06-25 DIAGNOSIS — N189 Chronic kidney disease, unspecified: Secondary | ICD-10-CM | POA: Diagnosis not present

## 2022-06-25 DIAGNOSIS — R519 Headache, unspecified: Secondary | ICD-10-CM | POA: Diagnosis not present

## 2022-06-25 DIAGNOSIS — Z6820 Body mass index (BMI) 20.0-20.9, adult: Secondary | ICD-10-CM | POA: Diagnosis not present

## 2022-06-25 DIAGNOSIS — G43809 Other migraine, not intractable, without status migrainosus: Secondary | ICD-10-CM | POA: Diagnosis not present

## 2022-06-25 DIAGNOSIS — Z1152 Encounter for screening for COVID-19: Secondary | ICD-10-CM | POA: Diagnosis not present

## 2022-06-25 DIAGNOSIS — R0602 Shortness of breath: Secondary | ICD-10-CM | POA: Diagnosis present

## 2022-06-25 DIAGNOSIS — R0600 Dyspnea, unspecified: Secondary | ICD-10-CM | POA: Diagnosis not present

## 2022-06-25 LAB — RESP PANEL BY RT-PCR (RSV, FLU A&B, COVID)  RVPGX2
Influenza A by PCR: NEGATIVE
Influenza B by PCR: NEGATIVE
Resp Syncytial Virus by PCR: NEGATIVE
SARS Coronavirus 2 by RT PCR: NEGATIVE

## 2022-06-25 LAB — CBC WITH DIFFERENTIAL/PLATELET
Abs Immature Granulocytes: 0.02 10*3/uL (ref 0.00–0.07)
Basophils Absolute: 0.1 10*3/uL (ref 0.0–0.1)
Basophils Relative: 1 %
Eosinophils Absolute: 0.1 10*3/uL (ref 0.0–0.5)
Eosinophils Relative: 1 %
HCT: 39.6 % (ref 36.0–46.0)
Hemoglobin: 13.7 g/dL (ref 12.0–15.0)
Immature Granulocytes: 0 %
Lymphocytes Relative: 29 %
Lymphs Abs: 2.4 10*3/uL (ref 0.7–4.0)
MCH: 30.5 pg (ref 26.0–34.0)
MCHC: 34.6 g/dL (ref 30.0–36.0)
MCV: 88.2 fL (ref 80.0–100.0)
Monocytes Absolute: 0.6 10*3/uL (ref 0.1–1.0)
Monocytes Relative: 7 %
Neutro Abs: 5 10*3/uL (ref 1.7–7.7)
Neutrophils Relative %: 62 %
Platelets: 253 10*3/uL (ref 150–400)
RBC: 4.49 MIL/uL (ref 3.87–5.11)
RDW: 13.1 % (ref 11.5–15.5)
WBC: 8.1 10*3/uL (ref 4.0–10.5)
nRBC: 0 % (ref 0.0–0.2)

## 2022-06-25 LAB — TROPONIN I (HIGH SENSITIVITY)
Troponin I (High Sensitivity): 4 ng/L (ref <18)
Troponin I (High Sensitivity): 3 ng/L (ref <18)

## 2022-06-25 LAB — BASIC METABOLIC PANEL
Anion gap: 10 (ref 5–15)
BUN: 12 mg/dL (ref 6–20)
CO2: 24 mmol/L (ref 22–32)
Calcium: 10.4 mg/dL — ABNORMAL HIGH (ref 8.9–10.3)
Chloride: 98 mmol/L (ref 98–111)
Creatinine, Ser: 1.03 mg/dL — ABNORMAL HIGH (ref 0.44–1.00)
GFR, Estimated: >60 mL/min (ref >60)
Glucose, Bld: 127 mg/dL — ABNORMAL HIGH (ref 70–99)
Potassium: 3.5 mmol/L (ref 3.5–5.1)
Sodium: 132 mmol/L — ABNORMAL LOW (ref 135–145)

## 2022-06-25 LAB — BRAIN NATRIURETIC PEPTIDE: B Natriuretic Peptide: 4.2 pg/mL (ref 0.0–100.0)

## 2022-06-25 NOTE — ED Provider Triage Note (Signed)
Emergency Medicine Provider Triage Evaluation Note  Shannon Morris , a 47 y.o. female  was evaluated in triage.  Pt complains of general malaise including shortness of breath, intermittent chest pain, worsening headaches which been increasing in severity and frequency felt behind the right eye.  Symptoms overall began 1 and half to 2 weeks ago.  She states that they have continued to worsen since that time.  She denies known fevers, abdominal pain, nausea, vomiting  Review of Systems  Positive: As above Negative: As above  Physical Exam  BP (!) 126/91 (BP Location: Right Arm)   Pulse (!) 111   Temp 97.7 F (36.5 C) (Oral)   Resp 18   Ht 5\' 6"  (1.676 m)   Wt 55.8 kg   LMP 12/02/2017   SpO2 100%   BMI 19.85 kg/m  Gen:   Awake, no distress   Resp:  Normal effort  MSK:   Moves extremities without difficulty  Other:    Medical Decision Making  Medically screening exam initiated at 6:32 PM.  Appropriate orders placed.  Shannon Morris was informed that the remainder of the evaluation will be completed by another provider, this initial triage assessment does not replace that evaluation, and the importance of remaining in the ED until their evaluation is complete.     Dorothyann Peng, [place] 06/25/22 [phone]

## 2022-06-25 NOTE — ED Triage Notes (Signed)
Pt reports she "doesn't feel good" and reports shortness of breath, nausea, pain between her shoulder blades, chest pain x 1.5 weeks. She reports the entire right side of her body is painful.

## 2022-06-26 NOTE — Discharge Instructions (Signed)
Take 40 mg of prednisone a day for the next 5 days.  I have referred you to neurology, they should contact you for an appointment in the next 2 to 3 weeks.

## 2022-06-26 NOTE — ED Provider Notes (Signed)
[facility] Provider Note   CSN: [id number] Arrival date & time: 06/25/22  1819     History  Chief Complaint  Patient presents with   Shortness of Breath    Shannon Morris is a 47 y.o. female.  Patient presents to the emergency department for evaluation of multiple problems.  Patient reports that she has multiple chronic problems including rheumatoid arthritis, chronic kidney disease.  She has not been feeling well for more than a week.  Patient reports that she has had a headache behind her right eye.  She does have a history of chronic recurrent headaches, migraines.  She has had dizziness with this.  This occurs intermittently, mostly with movement and ambulation.  Patient has noticed that she has been experiencing shortness of breath.  No significant cough.  She was tapering herself off of cigarettes but in the last month or so has gotten back to more than a pack a day.  Patient has chronic hip pain secondary to her RA, this has been bothering her a lot lately 2.       Home Medications Prior to Admission medications   Medication Sig Start Date End Date Taking? Authorizing Provider  cyclobenzaprine (FLEXERIL) 5 MG tablet Take 1 tablet (5 mg total) by mouth 3 (three) times daily as needed for muscle spasms. 10/13/20   Hughie Closs, PA-C  predniSONE (DELTASONE) 10 MG tablet Take 4 tablets by mouth with breakfast for 2 days, 2 tablets by mouth for 2 days and 1 tablet by mouth for 2 days. 10/13/20   Hughie Closs, PA-C      Allergies    Banana, Demerol [meperidine], and Coconut flavor    Review of Systems   Review of Systems  Physical Exam Updated Vital Signs BP (!) 137/97   Pulse 77   Temp 98 F (36.7 C)   Resp 20   Ht 5\' 6"  (1.676 m)   Wt 55.8 kg   LMP 12/02/2017   SpO2 100%   BMI 19.85 kg/m  Physical Exam Vitals and nursing note reviewed.  Constitutional:      General: She is not in acute distress.     Appearance: She is well-developed.  HENT:     Head: Normocephalic and atraumatic.     Mouth/Throat:     Mouth: Mucous membranes are moist.  Eyes:     General: Vision grossly intact. Gaze aligned appropriately.     Extraocular Movements: Extraocular movements intact.     Conjunctiva/sclera: Conjunctivae normal.  Cardiovascular:     Rate and Rhythm: Normal rate and regular rhythm.     Pulses: Normal pulses.     Heart sounds: Normal heart sounds, S1 normal and S2 normal. No murmur heard.    No friction rub. No gallop.  Pulmonary:     Effort: Pulmonary effort is normal. No respiratory distress.     Breath sounds: Normal breath sounds.  Abdominal:     General: Bowel sounds are normal.     Palpations: Abdomen is soft.     Tenderness: There is no abdominal tenderness. There is no guarding or rebound.     Hernia: No hernia is present.  Musculoskeletal:        General: No swelling.     Cervical back: Full passive range of motion without pain, normal range of motion and neck supple. No spinous process tenderness or muscular tenderness. Normal range of motion.     Right lower leg: No  edema.     Left lower leg: No edema.  Skin:    General: Skin is warm and dry.     Capillary Refill: Capillary refill takes less than 2 seconds.     Findings: No ecchymosis, erythema, rash or wound.  Neurological:     General: No focal deficit present.     Mental Status: She is alert and oriented to person, place, and time.     GCS: GCS eye subscore is 4. GCS verbal subscore is 5. GCS motor subscore is 6.     Cranial Nerves: Cranial nerves 2-12 are intact.     Sensory: Sensation is intact.     Motor: Motor function is intact.     Coordination: Coordination is intact.  Psychiatric:        Attention and Perception: Attention normal.        Mood and Affect: Mood normal.        Speech: Speech normal.        Behavior: Behavior normal.     ED Results / Procedures / Treatments   Labs (all labs ordered are  listed, but only abnormal results are displayed) Labs Reviewed  BASIC METABOLIC PANEL - Abnormal; Notable for the following components:      Result Value   Sodium 132 (*)    Glucose, Bld 127 (*)    Creatinine, Ser 1.03 (*)    Calcium 10.4 (*)    All other components within normal limits  RESP PANEL BY RT-PCR (RSV, FLU A&B, COVID)  [id number]  CBC WITH DIFFERENTIAL/PLATELET  BRAIN NATRIURETIC PEPTIDE  TROPONIN I (HIGH SENSITIVITY)  TROPONIN I (HIGH SENSITIVITY)    EKG EKG Interpretation  Date/Time:  Thursday June 25 2022 18:29:23 EDT Ventricular Rate:  79 PR Interval:  118 QRS Duration: 76 QT Interval:  342 QTC Calculation: 392 R Axis:   75 Text Interpretation: Normal sinus rhythm Cannot rule out Anterior infarct , age undetermined Abnormal ECG When compared with ECG of 11-May-2015 02:26, No significant change was found Confirmed by Orpah Greek [id number]) on 06/25/2022 11:43:38 PM  Radiology CT Head Wo Contrast  Result Date: 06/25/2022 CLINICAL DATA:  Worsening headache. EXAM: CT HEAD WITHOUT CONTRAST TECHNIQUE: Contiguous axial images were obtained from the base of the skull through the vertex without intravenous contrast. RADIATION DOSE REDUCTION: This exam was performed according to the departmental dose-optimization program which includes automated exposure control, adjustment of the mA and/or kV according to patient size and/or use of iterative reconstruction technique. COMPARISON:  None Available. FINDINGS: Brain: No evidence of acute infarction, hemorrhage, hydrocephalus, extra-axial collection or mass lesion/mass effect. Vascular: No hyperdense vessel or unexpected calcification. Skull: Normal. Negative for fracture or focal lesion. Sinuses/Orbits: No acute finding. Other: None. IMPRESSION: No acute intracranial pathology. Electronically Signed   By: Virgina Norfolk M.D.   On: 06/25/2022 20:20   DG Chest 2 View  Result Date: 06/25/2022 CLINICAL DATA:  Shortness of  breath. EXAM: CHEST - 2 VIEW COMPARISON:  June 17, 2021 FINDINGS: The heart size and mediastinal contours are within normal limits. Both lungs are clear. The visualized skeletal structures are unremarkable. IMPRESSION: No active cardiopulmonary disease. Electronically Signed   By: Virgina Norfolk M.D.   On: 06/25/2022 19:10    Procedures Procedures    Medications Ordered in ED Medications - No data to display  ED Course/ Medical Decision Making/ A&P  Medical Decision Making  Differential Diagnosis considered includes, but not limited to: COPD exacerbation; Bronchitis; Pneumonia; CHF; ACS; PE   Patient dents with multiple complaints.  Many of her complaints seem to be somewhat chronic in nature.  Currently her biggest complaint is right hip pain which has been ongoing for months and has been evaluated by her doctors.  She did have an MRI of the hip as an outpatient.  She has some pain and tenderness with of the hip but no acute findings.  No weakness of the lower extremities.  No workup here in the ED.  Patient mainly came in tonight because of shortness of breath.  This is likely multifactorial as well.  Patient does have a long smoking history and has recently increased her cigarettes per day because of the increased stress of her health problems.  Her lungs are clear currently.  She is not hypoxic.  PE considered but at this point I do not think that she has a PE.  She is not experiencing pleuritic chest pain.  She had some mild tachycardia at arrival but this has resolved.  No tachypnea.  Suspect that her shortness of breath is mostly secondary to being history.  [facility] x-ray does not show evidence of edema or pneumonia.  Patient also complaining of headache.  This seems to be a chronic recurrent problem as well.  She is on Topamax for these headaches but reports it is not helping.  She does not have an abnormal neurologic exam.  CT head unremarkable.  Discussed  the possibility of MRI but she reports that she needs general anesthesia for MRIs because of claustrophobia.  I was not possible tonight.  She does not want to try a normal MRI.  We have discussed outpatient follow-up with neurology and she agrees to this.  At this point I am recommending she take prednisone for a 5-day course to help with her lungs.  She has prednisone at home, does not need a prescription.  She does use albuterol at home, has not been aggressively using it.  She will use it every 2-4 hours for her shortness of breath.    Patient is to follow-up with her primary care doctor.  Neurology follow-up pending.  Given return precautions.        Final Clinical Impression(s) / ED Diagnoses Final diagnoses:  Dyspnea, unspecified type  Other migraine without status migrainosus, not intractable    Rx / DC Orders ED Discharge Orders          Ordered    Ambulatory referral to Neurology       Comments: An appointment is requested in approximately: 2 weeks   06/26/22 0026              Orpah Greek, MD 06/26/22 [phone]

## 2022-07-08 DIAGNOSIS — Z79899 Other long term (current) drug therapy: Secondary | ICD-10-CM | POA: Diagnosis not present

## 2022-07-28 DIAGNOSIS — G8929 Other chronic pain: Secondary | ICD-10-CM | POA: Diagnosis not present

## 2022-07-28 DIAGNOSIS — R519 Headache, unspecified: Secondary | ICD-10-CM | POA: Diagnosis not present

## 2022-07-28 DIAGNOSIS — G894 Chronic pain syndrome: Secondary | ICD-10-CM | POA: Diagnosis not present

## 2022-07-28 DIAGNOSIS — M2550 Pain in unspecified joint: Secondary | ICD-10-CM | POA: Diagnosis not present

## 2022-08-07 DIAGNOSIS — Z79899 Other long term (current) drug therapy: Secondary | ICD-10-CM | POA: Diagnosis not present

## 2022-08-27 ENCOUNTER — Ambulatory Visit (INDEPENDENT_AMBULATORY_CARE_PROVIDER_SITE_OTHER): Payer: 59 | Admitting: Neurology

## 2022-08-27 ENCOUNTER — Encounter: Payer: Self-pay | Admitting: Neurology

## 2022-08-27 VITALS — BP 106/65 | HR 71 | Ht 66.0 in | Wt 126.6 lb

## 2022-08-27 DIAGNOSIS — R519 Headache, unspecified: Secondary | ICD-10-CM | POA: Diagnosis not present

## 2022-08-27 DIAGNOSIS — G4486 Cervicogenic headache: Secondary | ICD-10-CM

## 2022-08-27 NOTE — Patient Instructions (Addendum)
I think you have chronic headaches from multiple causes, including cervicogenic headaches meaning it comes from degenerative neck pain and neck disease.  You are already seeing a pain management doctor for this, I recommend you follow-up with them closely and see if there is anything else they can help you with for your neck pain.  You have been on Topamax but it is currently at 75 mg daily, you can discuss with your primary care if they can increase it to 100 mg daily.  You drink quite a bit of water indicating that you drink 150 to 200 ounces of water per day, sometimes drinking too much can dilute electrolytes and cause other problems, please discuss with your primary care and other providers including your rheumatologist and kidney specialist if you have 1 whether or not you should be drinking this much water. Please continue to work on smoking cessation.  Your headaches may also be from strain on the eye.  Please make sure you have seen an ophthalmologist for a full, dilated eye exam, you do indicate that you had a full exam within the past 6 months.  Please follow-up with your current providers and primary care.  Please limit caffeine as you are.  For pain behind the right eye intermittently, despite using eyeglasses, I do recommend that you see an ophthalmologist if you have not done so. Your neurological exam does not show any alarming findings.  You had a benign head CT recently through the emergency room.  I do not see a pressing reason for a brain MRI.  If you have any sudden onset of neurological symptoms such as one-sided weakness or numbness or tingling or droopy face or slurring of speech, please proceed to the emergency room immediately or call 911 if someone cannot take you to the emergency room. Due to your complicated history, I recommend that you talk to your primary care about seeing a headache specialist at a comprehensive headache wellness clinic or academic neurological center.  Sleep  deprivation can also cause headaches, please try to get enough sleep, 7 to 8 hours of sleep are generally recommended for an average adult.
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
## 2022-09-04 DIAGNOSIS — Z79899 Other long term (current) drug therapy: Secondary | ICD-10-CM | POA: Diagnosis not present

## 2022-12-02 ENCOUNTER — Other Ambulatory Visit: Payer: Self-pay | Admitting: Internal Medicine

## 2022-12-02 ENCOUNTER — Ambulatory Visit
Admission: RE | Admit: 2022-12-02 | Discharge: 2022-12-02 | Disposition: A | Discharge status: Home / Self Care | Payer: 59 | Source: Ambulatory Visit | Attending: Internal Medicine | Admitting: Internal Medicine

## 2022-12-02 DIAGNOSIS — R109 Unspecified abdominal pain: Secondary | ICD-10-CM

## 2022-12-03 ENCOUNTER — Other Ambulatory Visit: Payer: Self-pay | Admitting: Internal Medicine

## 2022-12-03 DIAGNOSIS — Z1231 Encounter for screening mammogram for malignant neoplasm of breast: Secondary | ICD-10-CM

## 2022-12-03 DIAGNOSIS — R911 Solitary pulmonary nodule: Secondary | ICD-10-CM

## 2022-12-23 ENCOUNTER — Ambulatory Visit
Admission: RE | Admit: 2022-12-23 | Discharge: 2022-12-23 | Disposition: A | Discharge status: Home / Self Care | Payer: 59 | Source: Ambulatory Visit | Attending: Internal Medicine | Admitting: Internal Medicine

## 2022-12-23 ENCOUNTER — Other Ambulatory Visit: Payer: 59

## 2022-12-23 DIAGNOSIS — R911 Solitary pulmonary nodule: Secondary | ICD-10-CM

## 2022-12-23 DIAGNOSIS — Z1231 Encounter for screening mammogram for malignant neoplasm of breast: Secondary | ICD-10-CM

## 2023-01-07 ENCOUNTER — Encounter: Payer: Self-pay | Admitting: Family Medicine

## 2024-01-04 ENCOUNTER — Other Ambulatory Visit: Payer: Self-pay | Admitting: Internal Medicine

## 2024-01-04 ENCOUNTER — Other Ambulatory Visit (HOSPITAL_COMMUNITY): Payer: Self-pay | Admitting: Internal Medicine

## 2024-01-04 DIAGNOSIS — F17200 Nicotine dependence, unspecified, uncomplicated: Secondary | ICD-10-CM

## 2024-01-04 DIAGNOSIS — R1011 Right upper quadrant pain: Secondary | ICD-10-CM

## 2024-01-05 ENCOUNTER — Ambulatory Visit (HOSPITAL_COMMUNITY)
Admission: RE | Admit: 2024-01-05 | Discharge: 2024-01-05 | Disposition: A | Discharge status: Home / Self Care | Source: Ambulatory Visit | Attending: Internal Medicine | Admitting: Internal Medicine

## 2024-01-05 DIAGNOSIS — R1011 Right upper quadrant pain: Secondary | ICD-10-CM | POA: Diagnosis present

## 2024-01-07 ENCOUNTER — Other Ambulatory Visit: Payer: Self-pay | Admitting: Internal Medicine

## 2024-01-07 DIAGNOSIS — Z1231 Encounter for screening mammogram for malignant neoplasm of breast: Secondary | ICD-10-CM

## 2024-01-10 ENCOUNTER — Ambulatory Visit
Admission: RE | Admit: 2024-01-10 | Discharge: 2024-01-10 | Disposition: A | Discharge status: Home / Self Care | Source: Ambulatory Visit | Attending: Internal Medicine | Admitting: Internal Medicine

## 2024-01-10 DIAGNOSIS — F17200 Nicotine dependence, unspecified, uncomplicated: Secondary | ICD-10-CM

## 2024-01-18 ENCOUNTER — Ambulatory Visit
Admission: RE | Admit: 2024-01-18 | Discharge: 2024-01-18 | Disposition: A | Source: Ambulatory Visit | Attending: Internal Medicine | Admitting: Internal Medicine

## 2024-01-18 DIAGNOSIS — Z1231 Encounter for screening mammogram for malignant neoplasm of breast: Secondary | ICD-10-CM

## 2024-01-21 ENCOUNTER — Other Ambulatory Visit: Payer: Self-pay | Admitting: Internal Medicine

## 2024-01-21 DIAGNOSIS — R1011 Right upper quadrant pain: Secondary | ICD-10-CM

## 2024-02-11 ENCOUNTER — Ambulatory Visit (HOSPITAL_COMMUNITY)
Admission: RE | Admit: 2024-02-11 | Discharge: 2024-02-11 | Disposition: A | Discharge status: Home / Self Care | Source: Ambulatory Visit | Attending: Internal Medicine | Admitting: Internal Medicine

## 2024-02-11 DIAGNOSIS — R1011 Right upper quadrant pain: Secondary | ICD-10-CM | POA: Diagnosis present

## 2024-02-11 MED ORDER — MORPHINE SULFATE (PF) 2 MG/ML IV SOLN
2.5000 mg | Freq: Once | INTRAVENOUS | Status: AC
  Administered 2024-02-11: 2.5 mg via INTRAVENOUS

## 2024-02-11 MED ORDER — TECHNETIUM TC 99M MEBROFENIN IV KIT
5.3300 | PACK | Freq: Once | INTRAVENOUS | Status: AC | PRN
  Administered 2024-02-11: 5.33 via INTRAVENOUS

## 2024-02-11 MED ORDER — MORPHINE SULFATE (PF) 2 MG/ML IV SOLN
INTRAVENOUS | Status: AC
  Filled 2024-02-11: qty 2
